# Patient Record
Sex: Male | Born: 1975 | Race: Asian | Hispanic: No | Marital: Married | Smoking: Former smoker
Health system: Southern US, Community
[De-identification: ages and names within clinical notes are randomized; demographics above are authoritative.]

## PROBLEM LIST (undated history)

## (undated) DIAGNOSIS — U071 COVID-19: Secondary | ICD-10-CM

---

## 2015-05-09 ENCOUNTER — Ambulatory Visit: Payer: Self-pay | Admitting: Family Medicine

## 2015-05-10 ENCOUNTER — Ambulatory Visit (INDEPENDENT_AMBULATORY_CARE_PROVIDER_SITE_OTHER): Payer: BLUE CROSS/BLUE SHIELD | Admitting: Family Medicine

## 2015-05-10 ENCOUNTER — Encounter: Payer: Self-pay | Admitting: Family Medicine

## 2015-05-10 VITALS — BP 110/68 | HR 98 | Temp 98.7°F | Resp 18 | Ht 65.0 in | Wt 151.0 lb

## 2015-05-10 DIAGNOSIS — M542 Cervicalgia: Secondary | ICD-10-CM

## 2015-05-10 DIAGNOSIS — M25519 Pain in unspecified shoulder: Secondary | ICD-10-CM | POA: Diagnosis not present

## 2015-05-10 DIAGNOSIS — R519 Headache, unspecified: Secondary | ICD-10-CM

## 2015-05-10 DIAGNOSIS — R51 Headache: Secondary | ICD-10-CM | POA: Diagnosis not present

## 2015-05-10 MED ORDER — NAPROXEN 500 MG PO TABS: 500.0000 mg | ORAL_TABLET | Freq: Two times a day (BID) | ORAL | Status: DC

## 2015-05-10 MED ORDER — TIZANIDINE HCL 2 MG PO TABS: 2.0000 mg | ORAL_TABLET | Freq: Three times a day (TID) | ORAL | Status: DC | PRN

## 2015-05-10 NOTE — Progress Notes (Signed)
Name: Armanda MagicHung Hot Ferre   MRN: [id number]    DOB: 02/25/1975   Date:05/10/2015       Progress Note  Subjective  Chief Complaint  Chief Complaint  Patient presents with  . Acute Visit    Head and back of neck pain    HPI  Pt. Presents for evaluation of scalp pain, neck and left posterior shoulder pain, onset 1 year ago. Pain is present intermittently, but recurrent. Moving the neck makes the pain better, shaking the head makes the scalp pain better.  No history of fall/injury to the head or neck. Has not taken any medications.   History reviewed. No pertinent past medical history.  History reviewed. No pertinent past surgical history.  Family History  Problem Relation Age of Onset  . Cancer Father     Prostate CA    Social History   Social History  . Marital Status: Single    Spouse Name: N/A  . Number of Children: N/A  . Years of Education: N/A   Occupational History  . Not on file.   Social History Main Topics  . Smoking status: Former Games developermoker  . Smokeless tobacco: Never Used  . Alcohol Use: 1.2 oz/week    2 Cans of beer per week     Comment: occasional  . Drug Use: No  . Sexual Activity:    Partners: Female   Other Topics Concern  . Not on file   Social History Narrative  . No narrative on file     Current outpatient prescriptions:  Marland Kitchen.  MULTIPLE VITAMIN PO, Take by mouth., Disp: , Rfl:   No Known Allergies   Review of Systems  Constitutional: Negative for fever and chills.  Eyes: Negative for blurred vision.  Musculoskeletal: Positive for neck pain. Negative for joint pain.  Neurological: Positive for headaches. Negative for dizziness.    Objective  Filed Vitals:   05/10/15 0921  BP: 110/68  Pulse: 98  Temp: 98.7 F (37.1 C)  TempSrc: Oral  Resp: 18  Height: 5\' 5"  (1.651 m)  Weight: 151 lb (68.493 kg)  SpO2: 98%    Physical Exam  Constitutional: He is well-developed, well-nourished, and in no distress.  HENT:  Head: Normocephalic.  Head is without contusion. Hair is normal.    Localized tenderness over the left side postero-central scalp area, no bruising, mass, or other skeletal abnormality visible.   Musculoskeletal:       Cervical back: He exhibits spasm.       Back:  Nursing note and vitals reviewed.     Assessment & Plan  1. Scalp pain Given the localized tenderness, obtain skull x-ray and follow-up. - DG Skull Complete; Future  2. Neck and shoulder pain Likely muscle spasm, start on high-dose NSAID and PRN muscle relaxant therapy, obtain x-ray of cervical spine to rule out any acute pathology. - DG Cervical Spine Complete; Future - naproxen (NAPROSYN) 500 MG tablet; Take 1 tablet (500 mg total) by mouth 2 (two) times daily with a meal.  Dispense: 20 tablet; Refill: 0 - tiZANidine (ZANAFLEX) 2 MG tablet; Take 1 tablet (2 mg total) by mouth every 8 (eight) hours as needed for muscle spasms.  Dispense: 30 tablet; Refill: 0   Oletta Buehring Asad A. Faylene KurtzShah [facility] 05/10/2015 9:43 AM

## 2015-05-25 ENCOUNTER — Ambulatory Visit: Payer: BLUE CROSS/BLUE SHIELD | Admitting: Family Medicine

## 2015-05-25 ENCOUNTER — Ambulatory Visit
Admission: RE | Admit: 2015-05-25 | Discharge: 2015-05-25 | Disposition: A | Discharge status: Home / Self Care | Payer: BLUE CROSS/BLUE SHIELD | Source: Ambulatory Visit | Attending: Family Medicine | Admitting: Family Medicine

## 2015-05-25 DIAGNOSIS — M25519 Pain in unspecified shoulder: Secondary | ICD-10-CM | POA: Insufficient documentation

## 2015-05-25 DIAGNOSIS — M542 Cervicalgia: Secondary | ICD-10-CM | POA: Diagnosis present

## 2015-05-25 DIAGNOSIS — R51 Headache: Secondary | ICD-10-CM | POA: Diagnosis not present

## 2015-05-25 DIAGNOSIS — M5030 Other cervical disc degeneration, unspecified cervical region: Secondary | ICD-10-CM | POA: Insufficient documentation

## 2015-05-25 DIAGNOSIS — R519 Headache, unspecified: Secondary | ICD-10-CM

## 2015-05-31 ENCOUNTER — Telehealth: Payer: Self-pay | Admitting: Family Medicine

## 2015-05-31 NOTE — Telephone Encounter (Signed)
X-ray results have been reported to patient  

## 2015-05-31 NOTE — Telephone Encounter (Signed)
Pt said he has not hear dback from his results yet. It was a Personal assistantxray

## 2015-06-13 ENCOUNTER — Ambulatory Visit: Payer: BLUE CROSS/BLUE SHIELD | Admitting: Family Medicine

## 2015-06-14 ENCOUNTER — Ambulatory Visit (INDEPENDENT_AMBULATORY_CARE_PROVIDER_SITE_OTHER): Payer: BLUE CROSS/BLUE SHIELD | Admitting: Family Medicine

## 2015-06-14 ENCOUNTER — Encounter: Payer: Self-pay | Admitting: Family Medicine

## 2015-06-14 VITALS — BP 107/78 | HR 77 | Temp 98.7°F | Resp 15 | Ht 65.0 in | Wt 146.6 lb

## 2015-06-14 DIAGNOSIS — R0982 Postnasal drip: Secondary | ICD-10-CM | POA: Insufficient documentation

## 2015-06-14 DIAGNOSIS — M542 Cervicalgia: Secondary | ICD-10-CM | POA: Diagnosis not present

## 2015-06-14 DIAGNOSIS — R519 Headache, unspecified: Secondary | ICD-10-CM

## 2015-06-14 DIAGNOSIS — R51 Headache: Secondary | ICD-10-CM | POA: Diagnosis not present

## 2015-06-14 DIAGNOSIS — M25519 Pain in unspecified shoulder: Secondary | ICD-10-CM | POA: Diagnosis not present

## 2015-06-14 DIAGNOSIS — J329 Chronic sinusitis, unspecified: Secondary | ICD-10-CM | POA: Diagnosis not present

## 2015-06-14 MED ORDER — FLUTICASONE PROPIONATE 50 MCG/ACT NA SUSP: 2.0000 | Freq: Every day | NASAL | Status: AC

## 2015-06-14 MED ORDER — BENZONATATE 100 MG PO CAPS: 100.0000 mg | ORAL_CAPSULE | Freq: Three times a day (TID) | ORAL | Status: AC | PRN

## 2015-06-14 NOTE — Progress Notes (Signed)
Name: Derek Kelly   MRN: [id number]    DOB: May 22, 1975   Date:06/14/2015       Progress Note  Subjective  Chief Complaint  Chief Complaint  Patient presents with  . Follow-up    Discuss x-ray results    HPI  Pt. Is here to review X ray results of his skull and Cervical Spine. X rays ordered in response to sharp pain in the left posterior scalp area, neck and left shoulder pain. X rays of skull reveal no acute abnormalities but a 4mm lucency in right parietal lobe of questionable significance. X rays of neck reveal mild degenerative disc disease at C4-C5 and C5-C6. He reports intermittent paresthesias in the neck, no pain. He has taken Naproxen and Tizanidine intermittently, which has helped some. In addition, patient is also requesting medication to help relieve itching in his throat, having to constantly clear his throat, and productive cough.  History reviewed. No pertinent past medical history.  History reviewed. No pertinent past surgical history.  Family History  Problem Relation Age of Onset  . Cancer Father     Prostate CA    Social History   Social History  . Marital Status: Single    Spouse Name: N/A  . Number of Children: N/A  . Years of Education: N/A   Occupational History  . Not on file.   Social History Main Topics  . Smoking status: Former Games developer  . Smokeless tobacco: Never Used  . Alcohol Use: 1.2 oz/week    2 Cans of beer per week     Comment: occasional  . Drug Use: No  . Sexual Activity:    Partners: Female   Other Topics Concern  . Not on file   Social History Narrative     Current outpatient prescriptions:  Marland Kitchen  MULTIPLE VITAMIN PO, Take by mouth., Disp: , Rfl:  .  naproxen (NAPROSYN) 500 MG tablet, Take 1 tablet (500 mg total) by mouth 2 (two) times daily with a meal. (Patient not taking: Reported on 06/14/2015), Disp: 20 tablet, Rfl: 0 .  tiZANidine (ZANAFLEX) 2 MG tablet, Take 1 tablet (2 mg total) by mouth every 8 (eight) hours as  needed for muscle spasms. (Patient not taking: Reported on 06/14/2015), Disp: 30 tablet, Rfl: 0  No Known Allergies   Review of Systems  Constitutional: Negative for fever and chills.  Eyes: Negative for blurred vision and double vision.  Musculoskeletal: Positive for neck pain.  Neurological: Positive for headaches.    Objective  Filed Vitals:   06/14/15 0834  BP: 107/78  Pulse: 77  Temp: 98.7 F (37.1 C)  TempSrc: Oral  Resp: 15  Height:  (1.651 m)  Weight: 146 lb 9.6 oz (66.497 kg)  SpO2: 98%    Physical Exam  Constitutional: He is oriented to person, place, and time and well-developed, well-nourished, and in no distress.  HENT:  Head: Normocephalic and atraumatic.    Nose: Right sinus exhibits no maxillary sinus tenderness and no frontal sinus tenderness. Left sinus exhibits no maxillary sinus tenderness and no frontal sinus tenderness.  Mouth/Throat: Posterior oropharyngeal erythema present. No posterior oropharyngeal edema.  Mild tenderness to palpation over the left posterior scalp on the parietal bone. Nasal turbinate hypertrophy, inflamaed mucosa  Cardiovascular: Normal rate and regular rhythm.   Pulmonary/Chest: Effort normal and breath sounds normal.  Musculoskeletal:       Cervical back: He exhibits no tenderness, no pain and no spasm.  Neurological: He is alert and  oriented to person, place, and time.  Nursing note and vitals reviewed.     Assessment & Plan  1. Scalp pain Persistent and recurrent pain, x-rays unremarkable. We will order CT of head without contrast for further assessment. - CT Head Wo Contrast; Future  2. 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 Defer treatment  3. Post-nasal drainage By history and exam, symptoms of postnasal drainage, we'll start on Flonase to relieve nasal swelling and inflammation. Tessalon for relief of coughing. - fluticasone  (FLONASE) 50 MCG/ACT nasal spray; Place 2 sprays into both nostrils daily.  Dispense: 16 g; Refill: 2 - benzonatate (TESSALON) 100 MG capsule; Take 1 capsule (100 mg total) by mouth 3 (three) times daily as needed for cough.  Dispense: 20 capsule; Refill: 0    Rosi Secrist Asad A. Faylene KurtzShah [facility] 06/14/2015 8:44 AM

## 2015-08-02 ENCOUNTER — Telehealth: Payer: Self-pay

## 2015-08-02 NOTE — Telephone Encounter (Signed)
Called patient to informed him his Insurance approved the CT scan and of his appointment July 6 at 4:30 p.m. But patient wanted me to call and cancel this appointment due to cost stating his x-ray cost him $300 out of pocket. Patient states after insurance paid $200 for his X-Ray he received a $300 bill and denies any further imaging.

## 2015-08-05 NOTE — Telephone Encounter (Signed)
There is an abnormal finding on his skull x-ray, which needs further imaging for evaluation. Discussed this with patient at his office visit. If he declines CT scan, we may not be able to find out the exact nature of that 4mm lucency on the right parietal bone

## 2015-08-11 ENCOUNTER — Ambulatory Visit: Payer: BLUE CROSS/BLUE SHIELD

## 2015-08-23 ENCOUNTER — Telehealth: Payer: Self-pay | Admitting: Emergency Medicine

## 2015-08-23 ENCOUNTER — Ambulatory Visit
Admission: RE | Admit: 2015-08-23 | Discharge: 2015-08-23 | Disposition: A | Discharge status: Home / Self Care | Payer: BLUE CROSS/BLUE SHIELD | Source: Ambulatory Visit | Attending: Family Medicine | Admitting: Family Medicine

## 2015-08-23 DIAGNOSIS — R51 Headache: Secondary | ICD-10-CM | POA: Insufficient documentation

## 2015-08-23 DIAGNOSIS — R519 Headache, unspecified: Secondary | ICD-10-CM

## 2015-08-23 NOTE — Telephone Encounter (Signed)
Patient notified of CT result

## 2018-02-14 IMAGING — CT CT HEAD W/O CM
3 series · 16 of 47 positions shown, 19 images · non-contrast
Comparison: None.

CLINICAL DATA: Left-sided scalp pain for 3 months.

EXAM:
CT HEAD WITHOUT CONTRAST
TECHNIQUE: Contiguous axial images were obtained from the base of the skull
through the vertex without intravenous contrast.

[Series 2: head wo · axial · 0.41mm/px · z∈[+230,+354]mm · 10 of 30 slices shown, 13 images]
[im 3/30  brain]
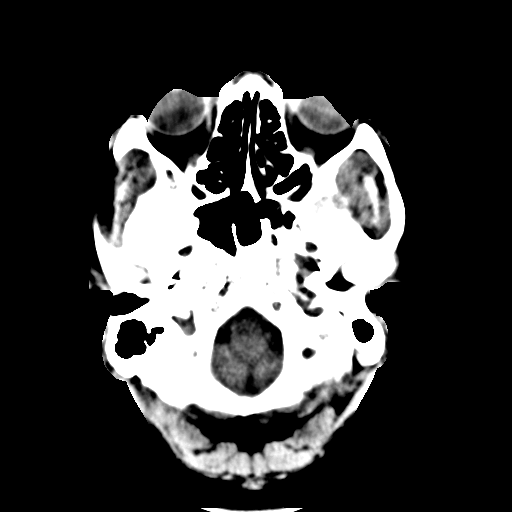
[im 3/30  bone]
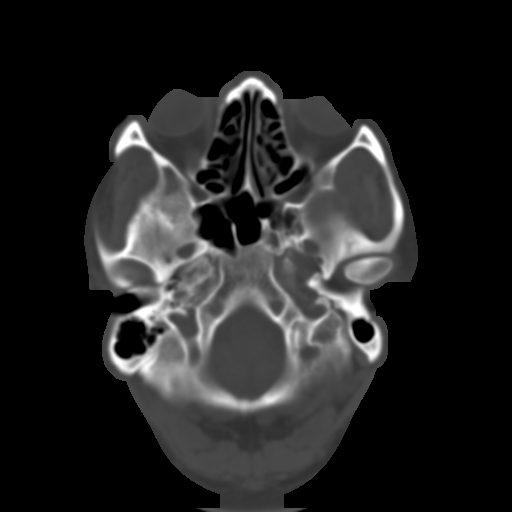
[im 6/30  brain]
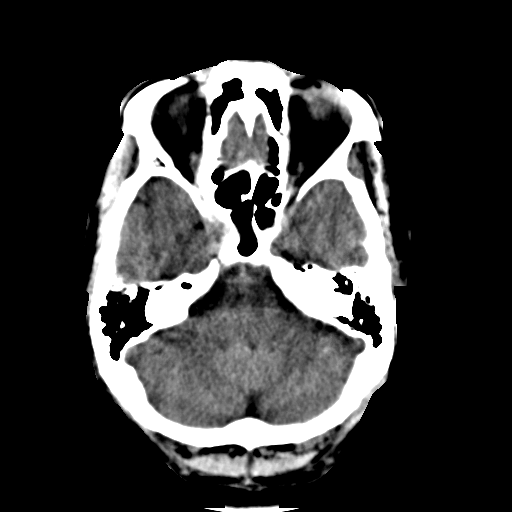
[im 9/30  brain]
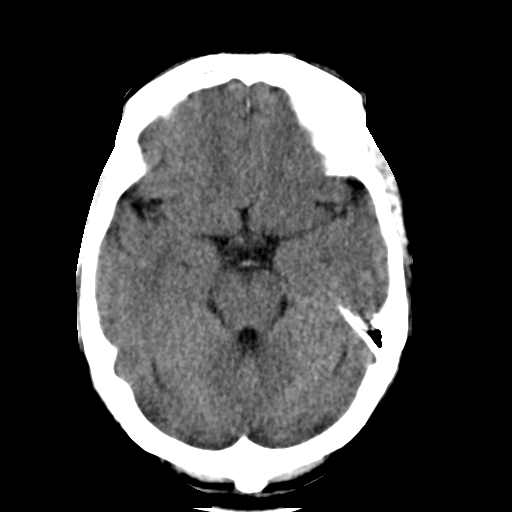
[im 11/30  brain]
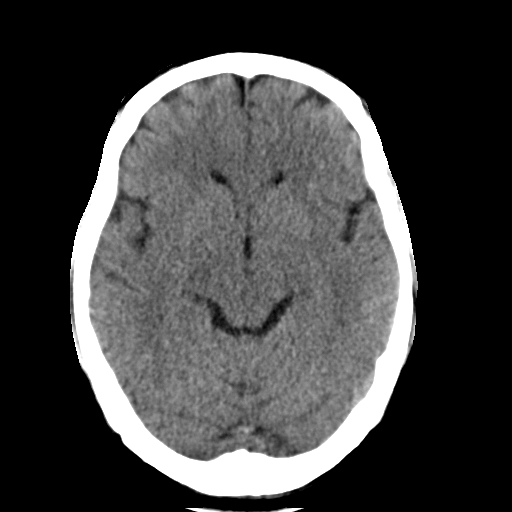
[im 14/30  brain]
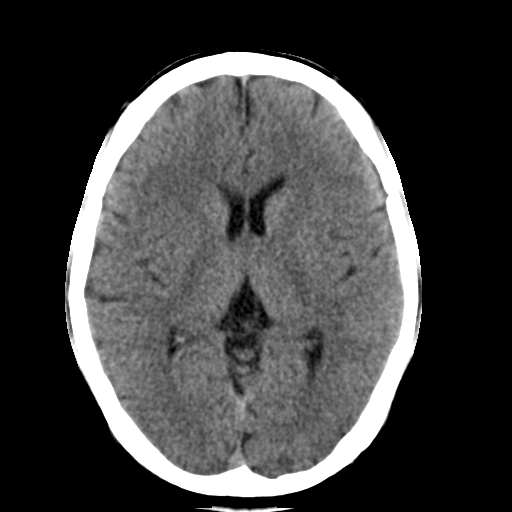
[im 14/30  bone]
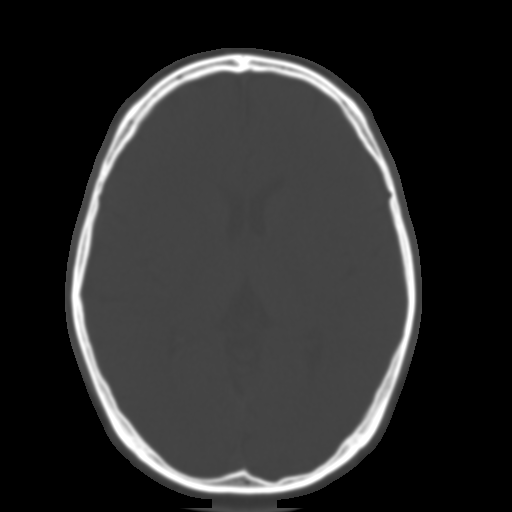
[im 17/30  brain]
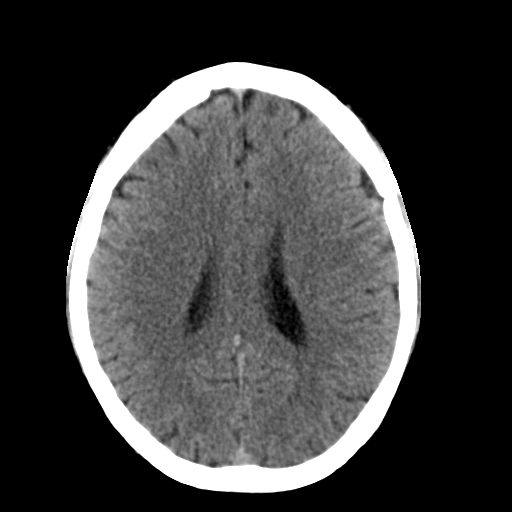
[im 20/30  brain]
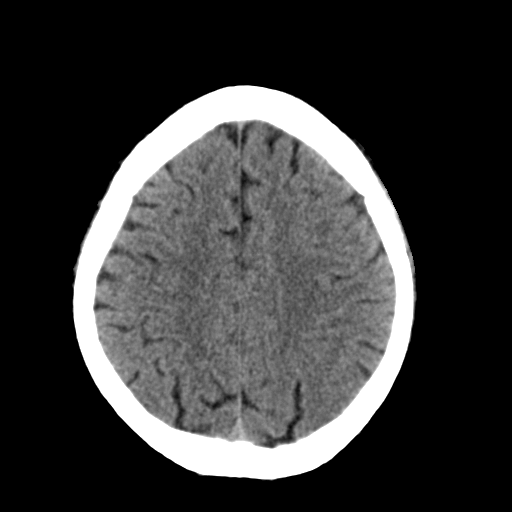
[im 23/30  brain]
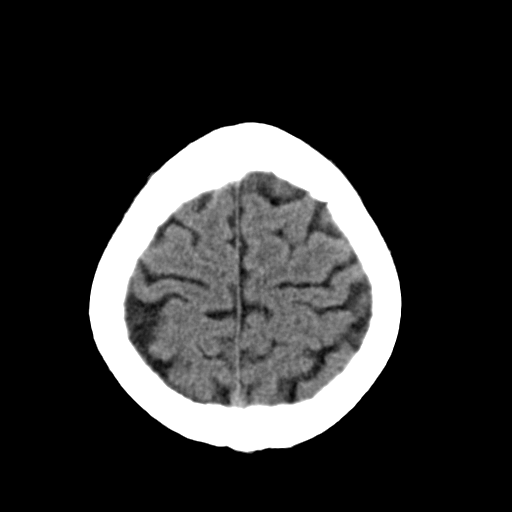
[im 25/30  brain]
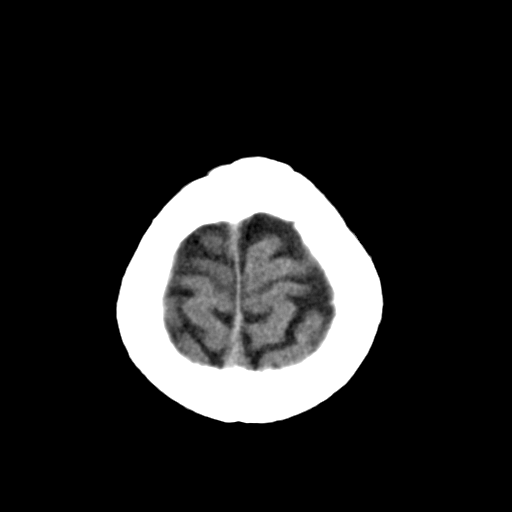
[im 25/30  bone]
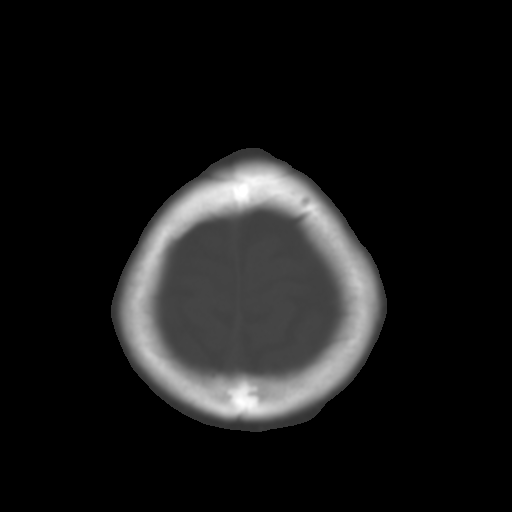
[im 28/30  brain]
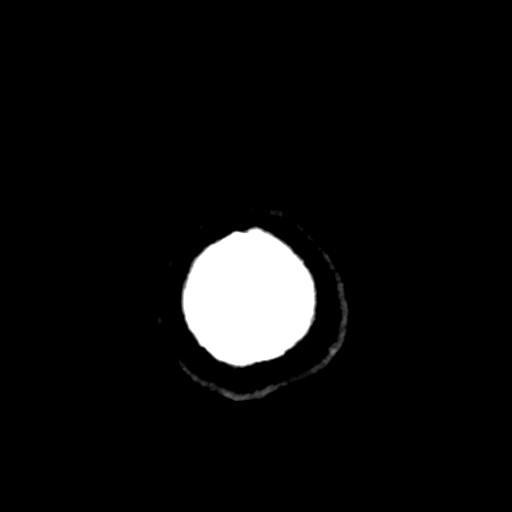

[Series 4: coronal soft · coronal · 0.30mm/px · 3 of 64 slices shown]
[im 22/64  brain]
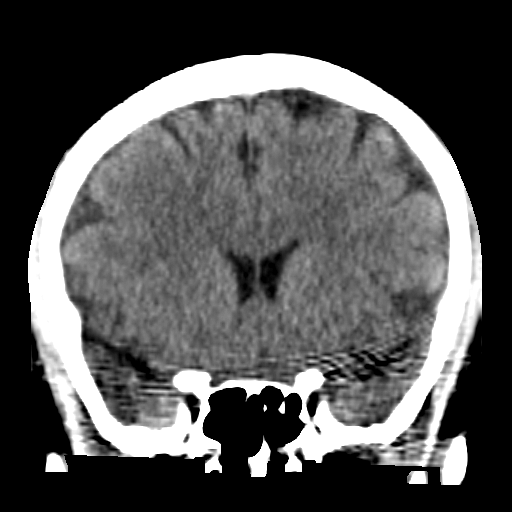
[im 29/64  brain]
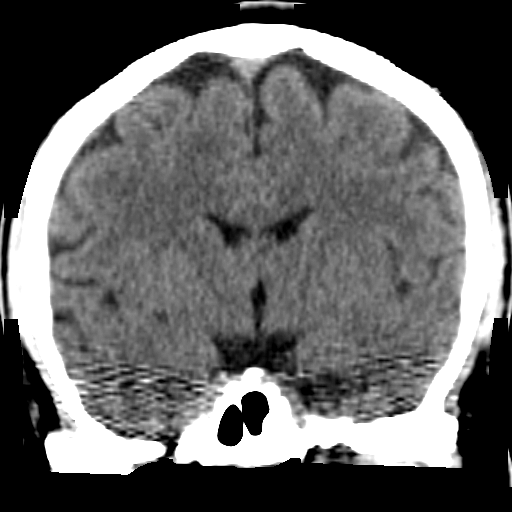
[im 36/64  brain]
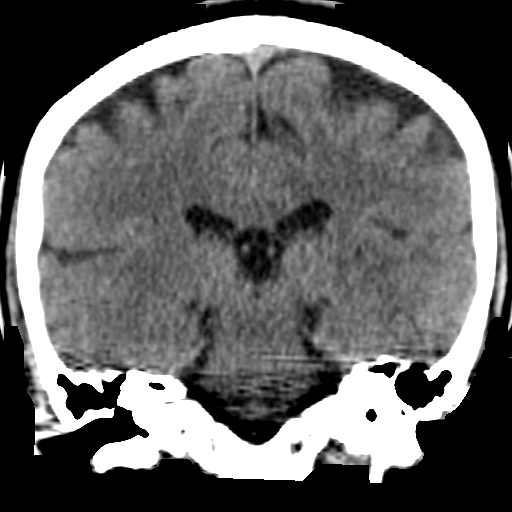

[Series 5: sagittal soft · sagittal · 0.29mm/px · 3 of 52 slices shown]
[im 18/52  brain]
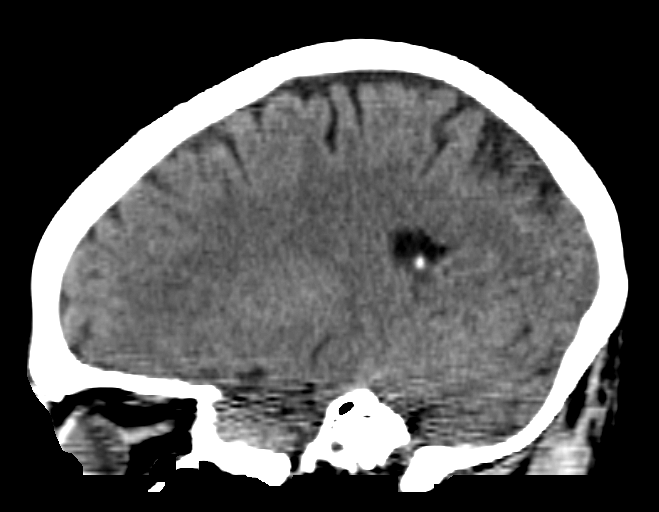
[im 26/52  brain]
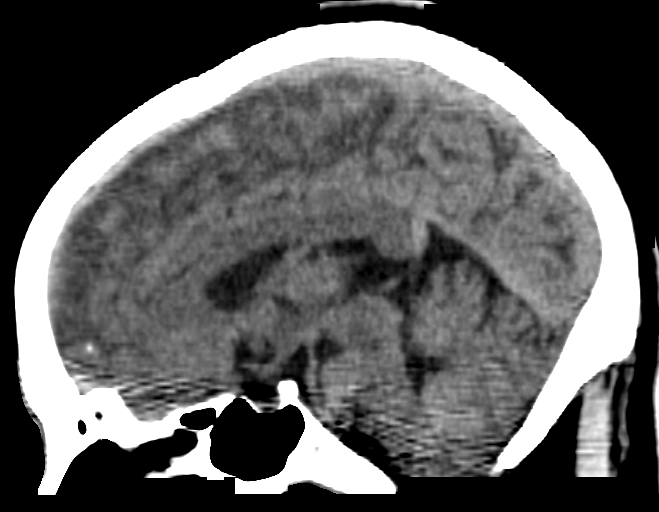
[im 35/52  brain]
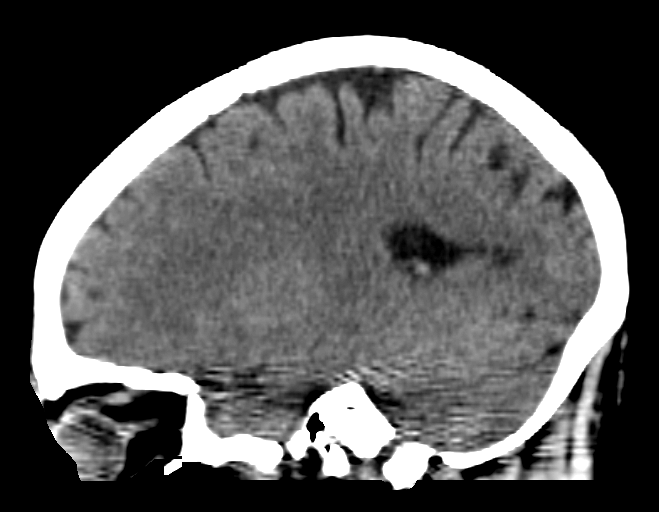

[16 of 47 positions shown; findings below may reference images not displayed]

FINDINGS: Bony calvarium appears intact. No mass effect or midline shift is
noted. Ventricular size is within normal limits. There is no
evidence of mass lesion, hemorrhage or acute infarction.
IMPRESSION: Normal head CT.

## 2019-02-11 ENCOUNTER — Other Ambulatory Visit: Payer: Self-pay

## 2019-02-11 ENCOUNTER — Encounter: Payer: Self-pay | Admitting: Emergency Medicine

## 2019-02-11 ENCOUNTER — Other Ambulatory Visit: Payer: BLUE CROSS/BLUE SHIELD

## 2019-02-11 ENCOUNTER — Emergency Department: Payer: BLUE CROSS/BLUE SHIELD

## 2019-02-11 ENCOUNTER — Emergency Department
Admission: EM | Admit: 2019-02-11 | Discharge: 2019-02-11 | Disposition: A | Discharge status: Home / Self Care | Payer: BLUE CROSS/BLUE SHIELD | Attending: Emergency Medicine | Admitting: Emergency Medicine

## 2019-02-11 DIAGNOSIS — Z87891 Personal history of nicotine dependence: Secondary | ICD-10-CM | POA: Insufficient documentation

## 2019-02-11 DIAGNOSIS — U071 COVID-19: Secondary | ICD-10-CM | POA: Insufficient documentation

## 2019-02-11 DIAGNOSIS — Z79899 Other long term (current) drug therapy: Secondary | ICD-10-CM | POA: Diagnosis not present

## 2019-02-11 DIAGNOSIS — J1289 Other viral pneumonia: Secondary | ICD-10-CM | POA: Diagnosis not present

## 2019-02-11 DIAGNOSIS — R509 Fever, unspecified: Secondary | ICD-10-CM | POA: Diagnosis present

## 2019-02-11 DIAGNOSIS — J1282 Pneumonia due to coronavirus disease 2019: Secondary | ICD-10-CM

## 2019-02-11 LAB — SARS CORONAVIRUS 2 (TAT 6-24 HRS): SARS Coronavirus 2: POSITIVE — AB

## 2019-02-11 MED ORDER — ACETAMINOPHEN 500 MG PO TABS: 500.0000 mg | ORAL_TABLET | Freq: Four times a day (QID) | ORAL | 0 refills | Status: AC | PRN

## 2019-02-11 MED ORDER — AZITHROMYCIN 500 MG PO TABS
500.0000 mg | ORAL_TABLET | Freq: Once | ORAL | Status: AC
  Administered 2019-02-11: 11:00:00 500 mg via ORAL
  Filled 2019-02-11: qty 1

## 2019-02-11 MED ORDER — AZITHROMYCIN 250 MG PO TABS: ORAL_TABLET | ORAL | 0 refills | Status: AC

## 2019-02-11 MED ORDER — ACETAMINOPHEN 325 MG PO TABS
650.0000 mg | ORAL_TABLET | Freq: Once | ORAL | Status: AC
  Administered 2019-02-11: 10:00:00 650 mg via ORAL
  Filled 2019-02-11: qty 2

## 2019-02-11 NOTE — ED Provider Notes (Signed)
[facility] Emergency Department Provider Note  ____________________________________________  Time seen: Approximately 9:38 AM  I have reviewed the triage vital signs and the nursing notes.   HISTORY  Chief Complaint Fever and Headache    HPI [facility] Hosier is a 44 y.o. male that presents to the emergency department for evaluation of fever, headache, body ache, chills for 8 days.  Patient states that he started with a nonproductive cough 2 days ago.  He also had diarrhea 2 days ago and again yesterday but none today.  He has no sense of taste, which he says is making it hard for him to eat.  His brother, father, nephew all have Covid.  His father was living with him briefly when he had Covid.  No shortness of breath, chest pain, vomiting.   History reviewed. No pertinent past medical history.  Patient Active Problem List   Diagnosis Date Noted  . Post-nasal drainage 06/14/2015  . Scalp pain 05/10/2015  . Neck and shoulder pain 05/10/2015    History reviewed. No pertinent surgical history.  Prior to Admission medications   Medication Sig Start Date End Date Taking? Authorizing Provider  acetaminophen (TYLENOL) 500 MG tablet Take 1 tablet (500 mg total) by mouth every 6 (six) hours as needed. 02/11/19   Enid Derry, PA-C  azithromycin (ZITHROMAX Z-PAK) 250 MG tablet Take 2 tablets (500 mg) on  Day 1,  followed by 1 tablet (250 mg) once daily on Days 2 through 5. 02/12/19   Enid Derry, PA-C  benzonatate (TESSALON) 100 MG capsule Take 1 capsule (100 mg total) by mouth 3 (three) times daily as needed for cough. 06/14/15   Ellyn Hack, MD  fluticasone (FLONASE) 50 MCG/ACT nasal spray Place 2 sprays into both nostrils daily. 06/14/15   Ellyn Hack, MD  MULTIPLE VITAMIN PO Take by mouth.    [provider]    Allergies Patient has no known allergies.  Family History  Problem Relation Age of Onset  . Cancer Father        Prostate CA     Social History Social History   Tobacco Use  . Smoking status: Former Games developer  . Smokeless tobacco: Never Used  Substance Use Topics  . Alcohol use: Yes    Alcohol/week: 2.0 standard drinks    Types: 2 Cans of beer per week    Comment: occasional  . Drug use: No     Review of Systems  Constitutional: Positive for fever. Eyes: No visual changes. No discharge. ENT: Negative for congestion and rhinorrhea. Cardiovascular: No chest pain. Respiratory: Positive for cough. No SOB. Gastrointestinal: No abdominal pain.  No nausea, no vomiting.  No diarrhea today.  No constipation. Musculoskeletal: Positive for body aches. Skin: Negative for rash, abrasions, lacerations, ecchymosis. Neurological: Positive for headache.   ____________________________________________   PHYSICAL EXAM:  VITAL SIGNS: ED Triage Vitals  Enc Vitals Group     BP 02/11/19 0841 (!) 111/57     Pulse Rate 02/11/19 0841 94     Resp 02/11/19 0841 20     Temp 02/11/19 0841 (!) 102.5 F (39.2 C)     Temp Source 02/11/19 0841 Oral     SpO2 02/11/19 0841 97 %     Weight 02/11/19 0839 150 lb (68 kg)     Height 02/11/19 0839 5\' 5"  (1.651 m)     Head Circumference --      Peak Flow --      Pain  Score 02/11/19 0839 9     Pain Loc --      Pain Edu? --      Excl. in [place]? --      Constitutional: Alert and oriented. Well appearing and in no acute distress. Eyes: Conjunctivae are normal. PERRL. EOMI. No discharge. Head: Atraumatic. ENT: No frontal and maxillary sinus tenderness.      Ears: Tympanic membranes pearly gray with good landmarks. No discharge.      Nose: Mild congestion/rhinnorhea.      Mouth/Throat: Mucous membranes are moist. Oropharynx non-erythematous. Tonsils not enlarged. No exudates. Uvula midline. Neck: No stridor.   Hematological/Lymphatic/Immunilogical: No cervical lymphadenopathy. Cardiovascular: Normal rate, regular rhythm.  Good peripheral circulation. Respiratory: Normal  respiratory effort without tachypnea or retractions. Lungs CTAB. Good air entry to the bases with no decreased or absent breath sounds. Gastrointestinal: Bowel sounds 4 quadrants. Soft and nontender to palpation. No guarding or rigidity. No palpable masses. No distention. Musculoskeletal: Full range of motion to all extremities. No gross deformities appreciated. Neurologic:  Normal speech and language. No gross focal neurologic deficits are appreciated.  Skin:  Skin is warm, dry and intact. No rash noted. Psychiatric: Mood and affect are normal. Speech and behavior are normal. Patient exhibits appropriate insight and judgement.   ____________________________________________   LABS (all labs ordered are listed, but only abnormal results are displayed)  Labs Reviewed  SARS CORONAVIRUS 2 (TAT 6-24 HRS)   ____________________________________________  EKG   ____________________________________________  RADIOLOGY Robinette Haines, personally viewed and evaluated these images (plain radiographs) as part of my medical decision making, as well as reviewing the written report by the radiologist.  DG Chest Portable 1 View  Result Date: 02/11/2019 CLINICAL DATA:  44 year old male with cough, fever and headache. Exposure to COVID-19. EXAM: PORTABLE CHEST 1 VIEW COMPARISON:  None. FINDINGS: Portable AP upright view at 1001 hours. Patchy and confluent left mid lung opacity, peripherally located. Subtle additional asymmetric opacity at both lung bases also greater on the left. No superimposed pneumothorax, pulmonary edema or pleural effusion. Normal cardiac size and mediastinal contours. Visualized tracheal air column is within normal limits. Negative visible bowel gas and osseous structures. IMPRESSION: Left greater than right patchy and indistinct pulmonary opacity compatible with COVID-19 pneumonia in this setting. Electronically Signed   By: Genevie Ann M.D.   On: 02/11/2019 10:09     ____________________________________________    PROCEDURES  Procedure(s) performed:    Procedures    Medications  acetaminophen (TYLENOL) tablet 650 mg (650 mg Oral Given 02/11/19 1022)  azithromycin (ZITHROMAX) tablet 500 mg (500 mg Oral Given 02/11/19 1123)     ____________________________________________   INITIAL IMPRESSION / ASSESSMENT AND PLAN / ED COURSE  Pertinent labs & imaging results that were available during my care of the patient were reviewed by me and considered in my medical decision making (see chart for details).  Review of the [place] CSRS was performed in accordance of the [place] prior to dispensing any controlled drugs.   Patient's diagnosis is consistent with pneumonia due to COVID-19. Vital signs and exam are reassuring.  Patient has had a very close prolonged contact to COVID-19.  Symptoms are consistent with COVID-19.  Chest x-ray consistent with bilateral pulmonary opacities, compatible with COVID-19.  Patient appears well and is staying well hydrated. Patient feels comfortable going home. Patient will be discharged home with prescriptions for azithromycin.  Patient is to follow up with primary care as needed or otherwise directed. Patient is given ED precautions  to return to the ED for any worsening or new symptoms.   [facility] was evaluated in Emergency Department on 02/11/2019 for the symptoms described in the history of present illness. He was evaluated in the context of the global COVID-19 pandemic, which necessitated consideration that the patient might be at risk for infection with the SARS-CoV-2 virus that causes COVID-19. Institutional protocols and algorithms that pertain to the evaluation of patients at risk for COVID-19 are in a state of rapid change based on information released by regulatory bodies including the CDC and federal and state organizations. These policies and algorithms were followed during the patient's care in the  ED.  ____________________________________________  FINAL CLINICAL IMPRESSION(S) / ED DIAGNOSES  Final diagnoses:  Pneumonia due to COVID-19 virus      NEW MEDICATIONS STARTED DURING THIS VISIT:  ED Discharge Orders         Ordered    azithromycin (ZITHROMAX Z-PAK) 250 MG tablet     02/11/19 1116    acetaminophen (TYLENOL) 500 MG tablet  Every 6 hours PRN     02/11/19 1116              This chart was dictated using voice recognition software/Dragon. Despite best efforts to proofread, errors can occur which can change the meaning. Any change was purely unintentional. .awhp   Enid Derry, PA-C 02/11/19 1212    Emily Filbert, MD 02/11/19 1304

## 2019-02-11 NOTE — ED Triage Notes (Signed)
Pt reports fever and HA for a few days. Pt reports has been exposed to COVID. Pt also states had some diarrhea but none since yesterday.

## 2019-02-11 NOTE — ED Notes (Signed)
Radiology to bedside. 

## 2019-02-13 ENCOUNTER — Telehealth: Payer: Self-pay

## 2019-02-13 NOTE — Telephone Encounter (Signed)
  SARS Coronavirus 2 positive test results given to patient. Discussed with patient on quarantine and precautions. Patient verbalized understanding.

## 2019-02-15 ENCOUNTER — Emergency Department
Admission: EM | Admit: 2019-02-15 | Discharge: 2019-02-15 | Disposition: A | Discharge status: Home / Self Care | Payer: BLUE CROSS/BLUE SHIELD | Attending: Emergency Medicine | Admitting: Emergency Medicine

## 2019-02-15 ENCOUNTER — Emergency Department: Payer: BLUE CROSS/BLUE SHIELD

## 2019-02-15 ENCOUNTER — Other Ambulatory Visit: Payer: Self-pay

## 2019-02-15 ENCOUNTER — Encounter: Payer: Self-pay | Admitting: Emergency Medicine

## 2019-02-15 DIAGNOSIS — U071 COVID-19: Secondary | ICD-10-CM | POA: Diagnosis not present

## 2019-02-15 DIAGNOSIS — Z87891 Personal history of nicotine dependence: Secondary | ICD-10-CM | POA: Diagnosis not present

## 2019-02-15 DIAGNOSIS — Z79899 Other long term (current) drug therapy: Secondary | ICD-10-CM | POA: Insufficient documentation

## 2019-02-15 DIAGNOSIS — R61 Generalized hyperhidrosis: Secondary | ICD-10-CM | POA: Diagnosis not present

## 2019-02-15 DIAGNOSIS — M791 Myalgia, unspecified site: Secondary | ICD-10-CM | POA: Diagnosis not present

## 2019-02-15 DIAGNOSIS — R5381 Other malaise: Secondary | ICD-10-CM | POA: Diagnosis present

## 2019-02-15 HISTORY — DX: COVID-19: U07.1

## 2019-02-15 LAB — BASIC METABOLIC PANEL
Anion gap: 13 (ref 5–15)
BUN: 15 mg/dL (ref 6–20)
CO2: 21 mmol/L — ABNORMAL LOW (ref 22–32)
Calcium: 8.3 mg/dL — ABNORMAL LOW (ref 8.9–10.3)
Chloride: 98 mmol/L (ref 98–111)
Creatinine, Ser: 1.28 mg/dL — ABNORMAL HIGH (ref 0.61–1.24)
GFR calc Af Amer: >60 mL/min (ref >60)
GFR calc non Af Amer: >60 mL/min (ref >60)
Glucose, Bld: 121 mg/dL — ABNORMAL HIGH (ref 70–99)
Potassium: 3.7 mmol/L (ref 3.5–5.1)
Sodium: 132 mmol/L — ABNORMAL LOW (ref 135–145)

## 2019-02-15 LAB — CBC
HCT: 39.8 % (ref 39.0–52.0)
Hemoglobin: 13.3 g/dL (ref 13.0–17.0)
MCH: 29 pg (ref 26.0–34.0)
MCHC: 33.4 g/dL (ref 30.0–36.0)
MCV: 86.7 fL (ref 80.0–100.0)
Platelets: 258 10*3/uL (ref 150–400)
RBC: 4.59 MIL/uL (ref 4.22–5.81)
RDW: 13.3 % (ref 11.5–15.5)
WBC: 10.7 10*3/uL — ABNORMAL HIGH (ref 4.0–10.5)
nRBC: 0 % (ref 0.0–0.2)

## 2019-02-15 MED ORDER — ALBUTEROL SULFATE (2.5 MG/3ML) 0.083% IN NEBU: 2.5000 mg | INHALATION_SOLUTION | Freq: Once | RESPIRATORY_TRACT | Status: DC

## 2019-02-15 MED ORDER — ALBUTEROL SULFATE HFA 108 (90 BASE) MCG/ACT IN AERS
2.0000 | INHALATION_SPRAY | Freq: Once | RESPIRATORY_TRACT | Status: AC
  Administered 2019-02-15: 2 via RESPIRATORY_TRACT
  Filled 2019-02-15: qty 6.7

## 2019-02-15 MED ORDER — ACETAMINOPHEN 325 MG PO TABS
650.0000 mg | ORAL_TABLET | Freq: Once | ORAL | Status: AC | PRN
  Administered 2019-02-15: 650 mg via ORAL
  Filled 2019-02-15: qty 2

## 2019-02-15 MED ORDER — SODIUM CHLORIDE 0.9 % IV BOLUS
1000.0000 mL | Freq: Once | INTRAVENOUS | Status: AC
  Administered 2019-02-15: 17:00:00 1000 mL via INTRAVENOUS

## 2019-02-15 NOTE — ED Provider Notes (Signed)
[facility] Emergency Department Provider Note ____________________________________________   First MD Initiated Contact with Patient 02/15/19 1624     (approximate)  I have reviewed the triage vital signs and the nursing notes.   HISTORY  Chief Complaint Cough, COVID Positive, and Shortness of Breath    HPI [facility] Crom is a 44 y.o. male with PMH as noted below including a diagnosis of COVID-19 on 02/13/2019 presents with persistent fever especially at night, sweats, myalgias, generalized weakness, and exertional shortness of breath.  The patient was seen in the ED on 1/6 but states that his symptoms have not improved since that time.  He states he has been taking Tylenol and the other medications prescribed on his last visit.  His wife and several other family members also have Covid.   Past Medical History:  Diagnosis Date  . COVID-19     Patient Active Problem List   Diagnosis Date Noted  . Post-nasal drainage 06/14/2015  . Scalp pain 05/10/2015  . Neck and shoulder pain 05/10/2015    History reviewed. No pertinent surgical history.  Prior to Admission medications   Medication Sig Start Date End Date Taking? Authorizing Provider  acetaminophen (TYLENOL) 500 MG tablet Take 1 tablet (500 mg total) by mouth every 6 (six) hours as needed. 02/11/19   Enid Derry, PA-C  azithromycin (ZITHROMAX Z-PAK) 250 MG tablet Take 2 tablets (500 mg) on  Day 1,  followed by 1 tablet (250 mg) once daily on Days 2 through 5. 02/12/19   Enid Derry, PA-C  benzonatate (TESSALON) 100 MG capsule Take 1 capsule (100 mg total) by mouth 3 (three) times daily as needed for cough. 06/14/15   Ellyn Hack, MD  fluticasone (FLONASE) 50 MCG/ACT nasal spray Place 2 sprays into both nostrils daily. 06/14/15   Ellyn Hack, MD  MULTIPLE VITAMIN PO Take by mouth.    [provider]    Allergies Patient has no known allergies.  Family History  Problem Relation  Age of Onset  . Cancer Father        Prostate CA    Social History Social History   Tobacco Use  . Smoking status: Former Games developer  . Smokeless tobacco: Never Used  Substance Use Topics  . Alcohol use: Yes    Alcohol/week: 2.0 standard drinks    Types: 2 Cans of beer per week    Comment: occasional  . Drug use: No    Review of Systems  Constitutional: Positive for fever. Eyes: No redness. ENT: No sore throat. Cardiovascular: Denies chest pain. Respiratory: Positive for shortness of breath. Gastrointestinal: No vomiting.  Positive for diarrhea.  Genitourinary: Negative for dysuria.  Musculoskeletal: Negative for back pain. Skin: Negative for rash. Neurological: Negative for headache.   ____________________________________________   PHYSICAL EXAM:  VITAL SIGNS: ED Triage Vitals [02/15/19 1519]  Enc Vitals Group     BP (!) 146/62     Pulse Rate 95     Resp 20     Temp (!) 102.8 F (39.3 C)     Temp Source Oral     SpO2 94 %     Weight 150 lb (68 kg)     Height 5\' 5"  (1.651 m)     Head Circumference      Peak Flow      Pain Score 0     Pain Loc      Pain Edu?      Excl. in GC?  Constitutional: Alert and oriented. Well appearing and in no acute distress. Eyes: Conjunctivae are normal.  Head: Atraumatic. Nose: No congestion/rhinnorhea. Mouth/Throat: Mucous membranes are moist.   Neck: Normal range of motion.  Cardiovascular: Normal rate, regular rhythm.  Good peripheral circulation. Respiratory: Normal respiratory effort.  No retractions.  Gastrointestinal: No distention.  Musculoskeletal: Extremities warm and well perfused.  Neurologic:  Normal speech and language. No gross focal neurologic deficits are appreciated.  Skin:  Skin is warm and dry. No rash noted. Psychiatric: Mood and affect are normal. Speech and behavior are normal.  ____________________________________________   LABS (all labs ordered are listed, but only abnormal results are  displayed)  Labs Reviewed  CBC - Abnormal; Notable for the following components:      Result Value   WBC 10.7 (*)    All other components within normal limits  BASIC METABOLIC PANEL - Abnormal; Notable for the following components:   Sodium 132 (*)    CO2 21 (*)    Glucose, Bld 121 (*)    Creatinine, Ser 1.28 (*)    Calcium 8.3 (*)    All other components within normal limits   ____________________________________________  EKG  ED ECG REPORT I, Dionne Bucy, the attending physician, personally viewed and interpreted this ECG.  Date: 02/15/2019 EKG Time: 1533 Rate: 96 Rhythm: normal sinus rhythm QRS Axis: normal Intervals: normal ST/T Wave abnormalities: normal Narrative Interpretation: no evidence of acute ischemia  ____________________________________________  RADIOLOGY  CXR: Persistent bibasilar infiltrates improving on the left  ____________________________________________   PROCEDURES  Procedure(s) performed: No  Procedures  Critical Care performed: No ____________________________________________   INITIAL IMPRESSION / ASSESSMENT AND PLAN / ED COURSE  Pertinent labs & imaging results that were available during my care of the patient were reviewed by me and considered in my medical decision making (see chart for details).  44 year old male with no significant active medical problems and a recent diagnosis of COVID-19 presents with persistent malaise, fever, intermittent cough and exertional shortness of breath after being diagnosed several days ago.  He states he overall has been sick for a little bit over a week.  I reviewed the past medical records in Epic and confirmed a positive COVID-19 test from 1/6.  On exam, the patient is overall well-appearing.  His vital signs are normal.  O2 saturation is in the mid to high 90s on room air and the patient has no significant increased work of breathing or respiratory distress.    We will obtain basic  labs, chest x-ray, give fluids, and reassess.  ----------------------------------------- 8:04 PM on 02/15/2019 -----------------------------------------  The lab work-up was reassuring.  Chest x-ray was slightly improved from prior.  Patient felt a bit better after fluids and Tylenol..  I also gave albuterol by MDI.  Patient was stable for discharge home.  I counseled him on the results of the work-up and on return precautions and he expressed understanding. _______________________________  Armanda Magic was evaluated in Emergency Department on 02/15/2019 for the symptoms described in the history of present illness. He was evaluated in the context of the global COVID-19 pandemic, which necessitated consideration that the patient might be at risk for infection with the SARS-CoV-2 virus that causes COVID-19. Institutional protocols and algorithms that pertain to the evaluation of patients at risk for COVID-19 are in a state of rapid change based on information released by regulatory bodies including the CDC and federal and state organizations. These policies and algorithms were followed during the patient's care  in the ED. ____________________________________________   FINAL CLINICAL IMPRESSION(S) / ED DIAGNOSES  Final diagnoses:  COVID-19      NEW MEDICATIONS STARTED DURING THIS VISIT:  New Prescriptions   No medications on file     Note:  This document was prepared using Dragon voice recognition software and may include unintentional dictation errors.    Arta Silence, MD 02/15/19 2005

## 2019-02-15 NOTE — ED Triage Notes (Signed)
FIRST NURSE NOTE- per EMS pt received his positive covid call.  Per EMS call came in because his wife has covid and is in hospital and he wanted to go to hospital.  Pt reported to EMS cough but no cough heard during transport. Sat 97% RA with EMS.  When EMS asked pt why coming to hospital he told them because he is covid positive.

## 2019-02-15 NOTE — Discharge Instructions (Addendum)
Continue the medications that you are previously prescribed.  You can use the albuterol inhaler that was given to you in the ER today as needed up to every 4-6 hours for shortness of breath or coughing.  In addition to the Tylenol, you also can take ibuprofen (Advil, Motrin) for fever.  Return to the ER for new or worsening shortness of breath, persistent high fever, worsening weakness, or any other new or worsening symptoms that concern you.

## 2019-02-15 NOTE — ED Triage Notes (Signed)
Pt arrived via [facility] with reports of COVID positive, c/o shortness of breath, diarrhea, fatigue.  Pt told EMS he was coming to hospital because he is COVID positive.

## 2019-02-15 NOTE — ED Notes (Signed)
Pt states that he is calling his sister to come give him a ride home from ED.

## 2019-02-23 ENCOUNTER — Ambulatory Visit: Payer: BLUE CROSS/BLUE SHIELD | Attending: Internal Medicine

## 2019-02-23 DIAGNOSIS — Z20822 Contact with and (suspected) exposure to covid-19: Secondary | ICD-10-CM

## 2019-02-25 ENCOUNTER — Telehealth: Payer: Self-pay | Admitting: Hematology

## 2019-02-25 ENCOUNTER — Ambulatory Visit: Payer: BLUE CROSS/BLUE SHIELD | Attending: Internal Medicine

## 2019-02-25 DIAGNOSIS — Z20822 Contact with and (suspected) exposure to covid-19: Secondary | ICD-10-CM

## 2019-02-25 LAB — NOVEL CORONAVIRUS, NAA

## 2019-02-25 NOTE — Telephone Encounter (Signed)
Pt is aware the was not enough specimen to confirm neg or positive results and pt is going to [facility] today to get retested

## 2019-02-26 LAB — NOVEL CORONAVIRUS, NAA: SARS-CoV-2, NAA: NOT DETECTED

## 2019-03-04 ENCOUNTER — Telehealth: Payer: Self-pay | Admitting: Family Medicine

## 2019-03-04 NOTE — Telephone Encounter (Signed)
Pt aware covid lab test negative, not detected °

## 2021-08-05 IMAGING — DX DG CHEST 1V PORT
1 series · 1 of 1 positions shown · non-contrast
Comparison: None.

CLINICAL DATA: 44-year-old male with cough, fever and headache.
Exposure to HHT56-GZ.

EXAM:
PORTABLE CHEST 1 VIEW

[chest ap]
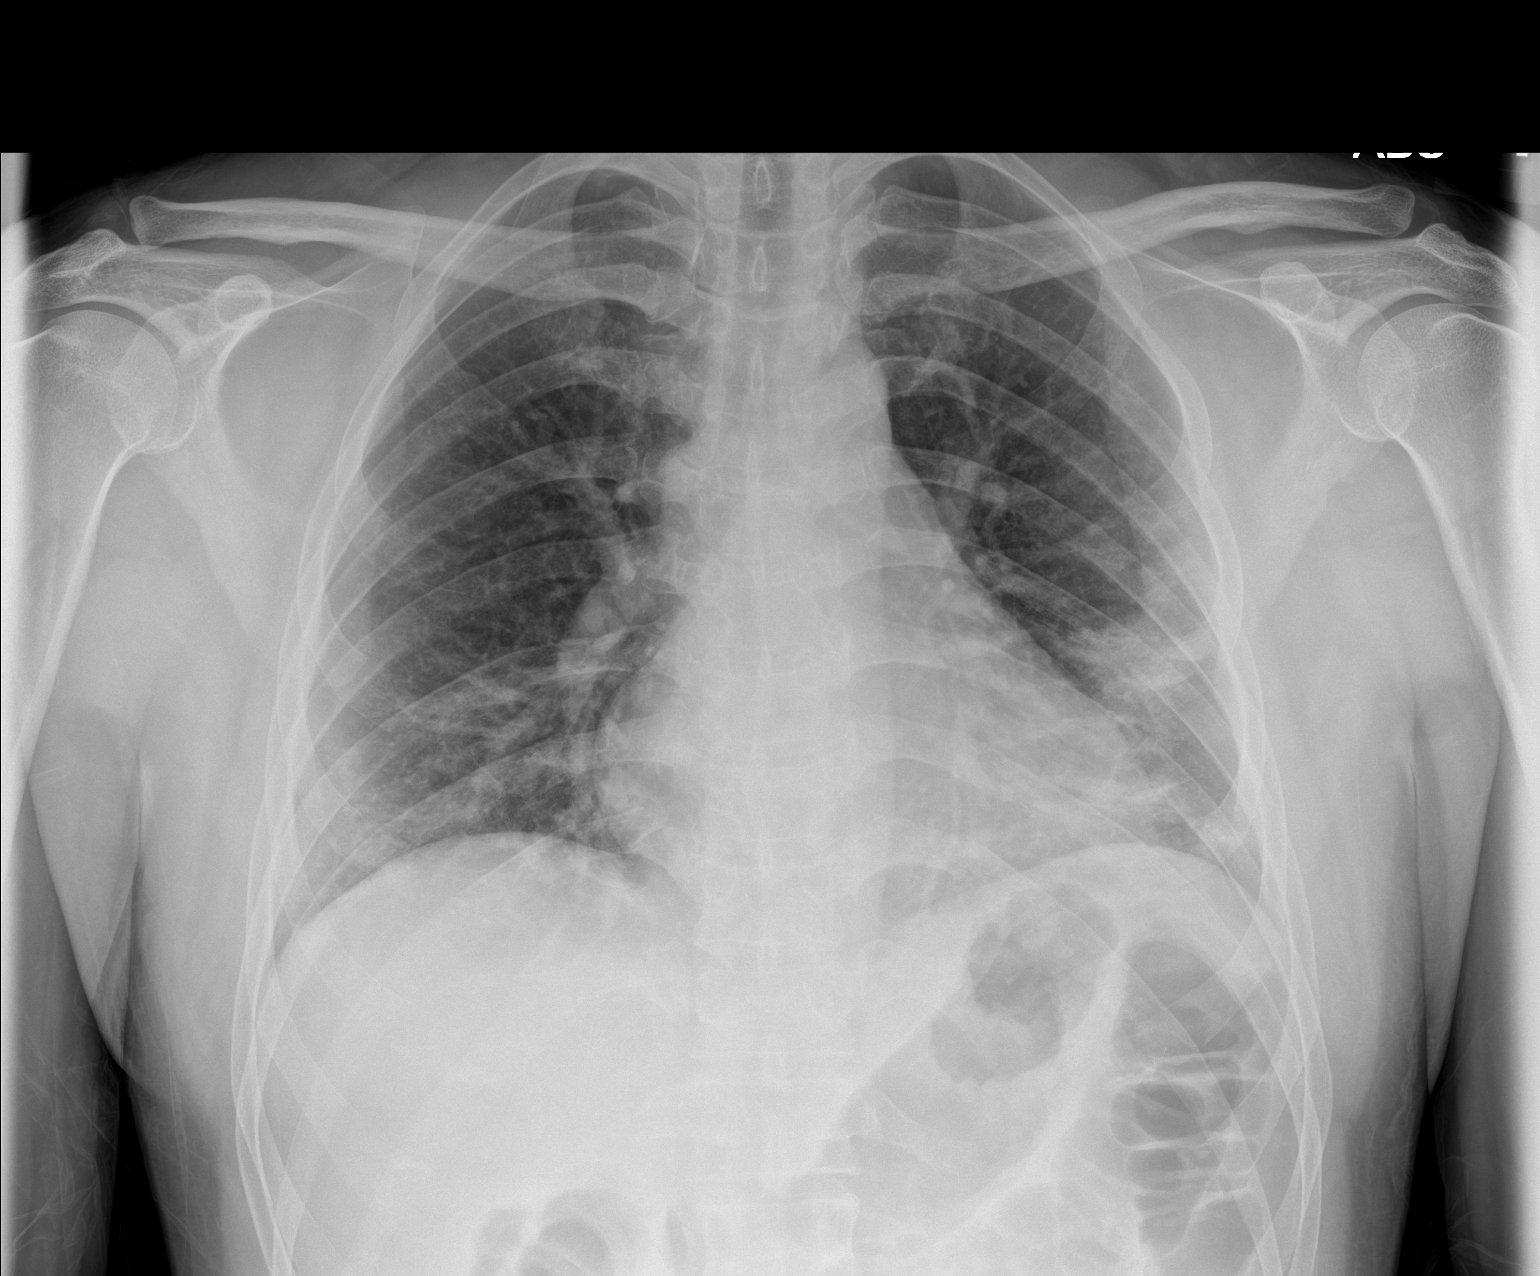

[1 of 1 positions shown; findings below may reference images not displayed]

FINDINGS: Portable AP upright view at 1221 hours. Patchy and confluent left
mid lung opacity, peripherally located. Subtle additional asymmetric
opacity at both lung bases also greater on the left. No superimposed
pneumothorax, pulmonary edema or pleural effusion. Normal cardiac
size and mediastinal contours. Visualized tracheal air column is
within normal limits. Negative visible bowel gas and osseous
structures.
IMPRESSION: Left greater than right patchy and indistinct pulmonary opacity
compatible with HHT56-GZ pneumonia in this setting.

## 2021-08-09 IMAGING — DX DG CHEST 1V PORT
1 series · 1 of 1 positions shown · non-contrast
Comparison: February 11, 2019

CLINICAL DATA: [id number]-9F patient.  Possible cough.

EXAM:
PORTABLE CHEST 1 VIEW

[chest ap]
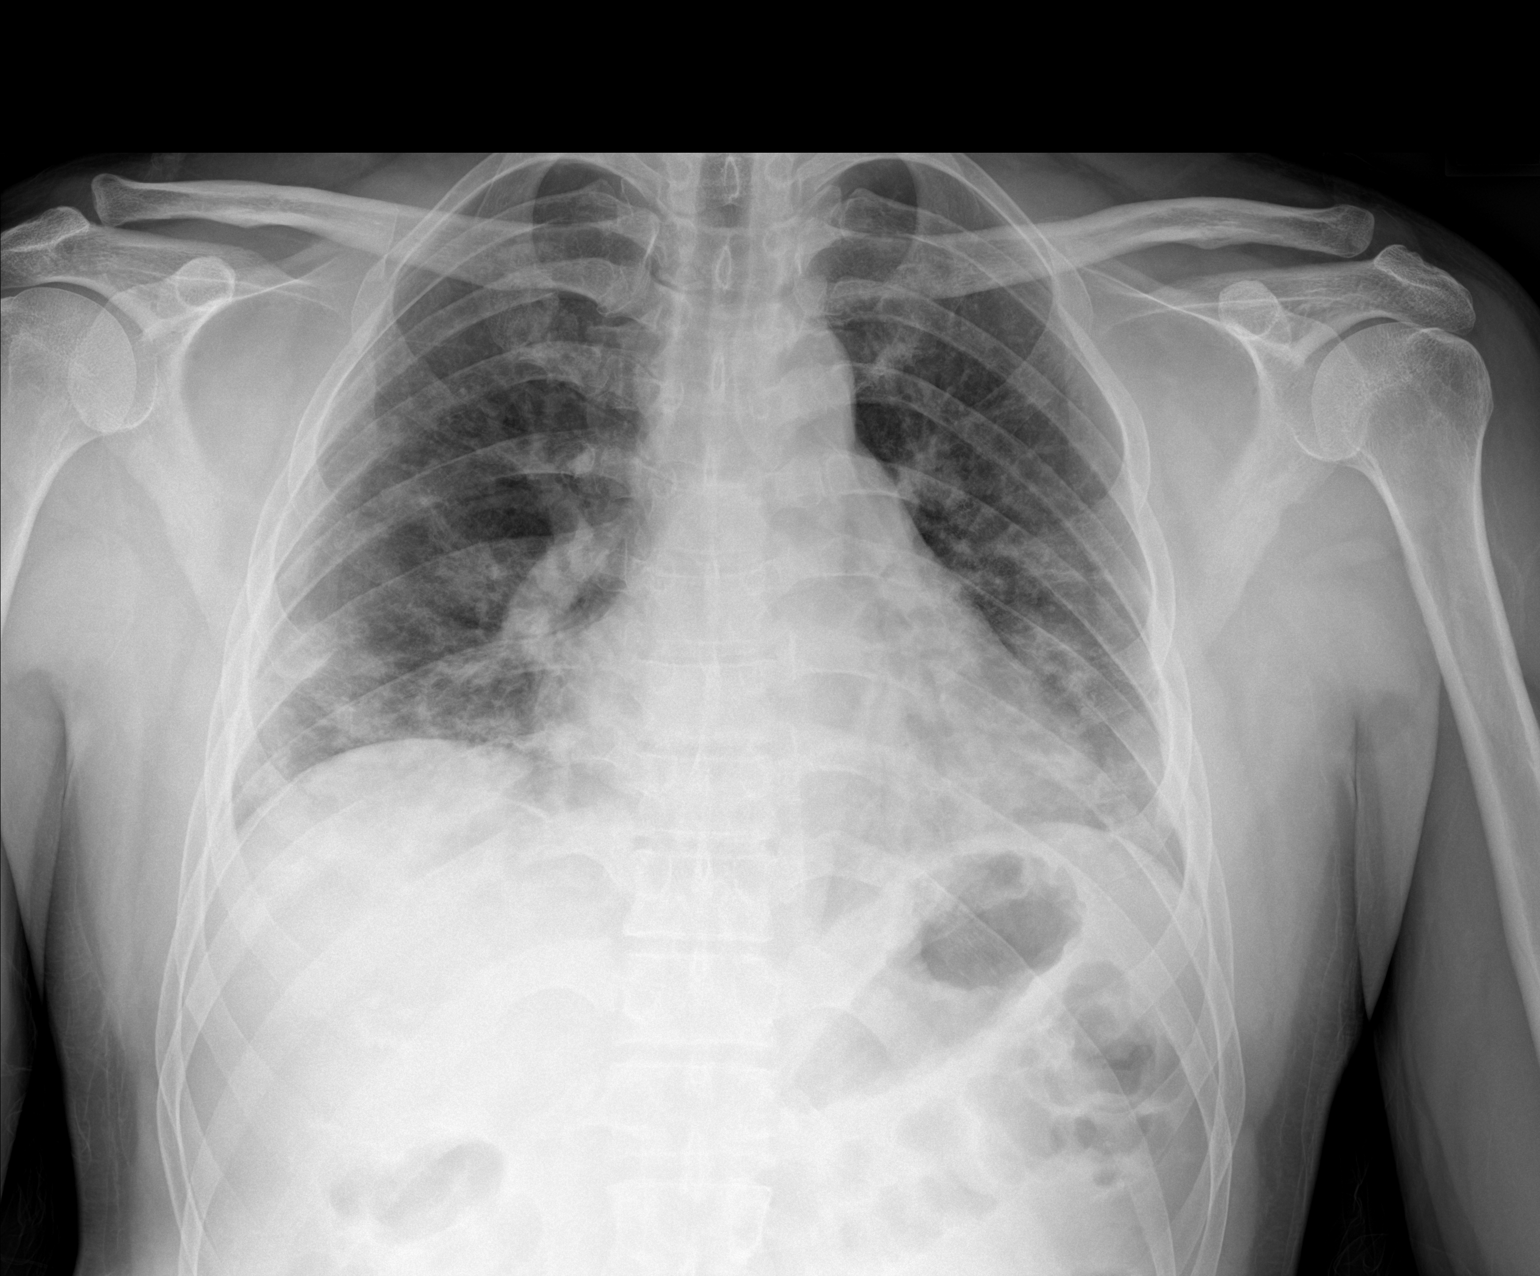

[1 of 1 positions shown; findings below may reference images not displayed]

FINDINGS: Left basilar infiltrate remains but is improved in the interval.
Mild opacity in the right base is similar in the interval.
Cardiomediastinal silhouette is stable. No pneumothorax. No other
abnormalities.
IMPRESSION: Improving left basilar infiltrate and stable right basilar
infiltrate consistent with the patient's AXIZW-9F status.

## 2022-06-14 ENCOUNTER — Encounter (INDEPENDENT_AMBULATORY_CARE_PROVIDER_SITE_OTHER): Payer: BLUE CROSS/BLUE SHIELD | Admitting: Ophthalmology

## 2022-06-20 ENCOUNTER — Encounter (INDEPENDENT_AMBULATORY_CARE_PROVIDER_SITE_OTHER): Payer: BLUE CROSS/BLUE SHIELD | Admitting: Ophthalmology

## 2022-06-20 DIAGNOSIS — H43813 Vitreous degeneration, bilateral: Secondary | ICD-10-CM

## 2022-06-20 DIAGNOSIS — H4421 Degenerative myopia, right eye: Secondary | ICD-10-CM

## 2022-06-21 ENCOUNTER — Encounter (INDEPENDENT_AMBULATORY_CARE_PROVIDER_SITE_OTHER): Payer: BLUE CROSS/BLUE SHIELD | Admitting: Ophthalmology
